# Patient Record
Sex: Male | Born: 2000 | Race: Black or African American | Hispanic: No | Marital: Single | Smoking: Never smoker
Health system: Southern US, Community
[De-identification: ages and names within clinical notes are randomized; demographics above are authoritative.]

## PROBLEM LIST (undated history)

## (undated) DIAGNOSIS — J9383 Other pneumothorax: Secondary | ICD-10-CM

## (undated) DIAGNOSIS — F988 Other specified behavioral and emotional disorders with onset usually occurring in childhood and adolescence: Secondary | ICD-10-CM

## (undated) DIAGNOSIS — J302 Other seasonal allergic rhinitis: Secondary | ICD-10-CM

## (undated) DIAGNOSIS — F809 Developmental disorder of speech and language, unspecified: Secondary | ICD-10-CM

---
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
## 2022-05-21 ENCOUNTER — Encounter (HOSPITAL_COMMUNITY): Payer: Self-pay

## 2022-05-21 ENCOUNTER — Ambulatory Visit (HOSPITAL_COMMUNITY)
Admission: EM | Admit: 2022-05-21 | Discharge: 2022-05-21 | Disposition: A | Discharge status: Home / Self Care | Payer: Medicaid Other

## 2022-05-21 DIAGNOSIS — Z7689 Persons encountering health services in other specified circumstances: Secondary | ICD-10-CM | POA: Diagnosis not present

## 2022-05-21 DIAGNOSIS — R051 Acute cough: Secondary | ICD-10-CM | POA: Diagnosis not present

## 2022-05-21 NOTE — ED Provider Notes (Signed)
[facility]    CSN: [id number] Arrival date & time: 05/21/22  1306     History   Chief Complaint Chief Complaint  Patient presents with   Cough   Letter for School/Work    HPI Kyle James is a 22 y.o. male.  Presents with 1 week history of cough Has improved over the week with OTC medications  Feeling a lot better today, cough is now occasional  No fevers recently Had flu exposures before symptom onset  Work is requiring note to return   History of spontaneous pneumothorax in 2021 Denies any shortness of breath or chest pain today  Past Medical History:  Diagnosis Date   Attention deficit disorder (ADD)    grandmother denies   Seasonal allergies    Speech delay    Spontaneous pneumothorax     Patient Active Problem List   Diagnosis Date Noted   Primary spontaneous pneumothorax 09/11/2019   Spontaneous pneumothorax 09/08/2019    Past Surgical History:  Procedure Laterality Date   PLEURADESIS Right 09/11/2019   Procedure: Mechanical Pleuradesis;  Surgeon: Lajuana Matte, MD;  Location: [facility] OR;  Service: Thoracic;  Laterality: Right;   VIDEO ASSISTED THORACOSCOPY (VATS)/WEDGE RESECTION Right 09/11/2019    XI ROBOTIC ASSISTED THORASCOPY-WEDGE RESECTION OF RIGHT UPPER AND RIGHT LOWER LOBE;  APICAL PLEURECTOMY      Home Medications    Prior to Admission medications   Medication Sig Start Date End Date Taking? Authorizing Provider  acetaminophen (TYLENOL) 500 MG tablet Take 500-1,000 mg by mouth every 6 (six) hours as needed for mild pain.    [provider]  doxycycline (VIBRAMYCIN) 100 MG capsule Take 1 capsule (100 mg total) by mouth 2 (two) times daily. 10/12/21   Nyoka Lint, PA-C  escitalopram (LEXAPRO) 10 MG tablet Take 1 tablet (10 mg total) by mouth daily. 07/08/21   Vanessa Kick, MD  tobramycin (TOBREX) 0.3 % ophthalmic solution Place 1 drop into the right eye every 2 (two) hours. 02/25/21   Jaynee Eagles, PA-C    Family  History Family History  Problem Relation Age of Onset   Diabetes Maternal Grandmother    Hypertension Maternal Grandmother    Hyperlipidemia Maternal Grandmother    Diabetes Maternal Great-grandmother     Social History Social History   Tobacco Use   Smoking status: Never   Smokeless tobacco: Never  Vaping Use   Vaping Use: Never used  Substance Use Topics   Alcohol use: Never   Drug use: Never     Allergies   Patient has no known allergies.   Review of Systems Review of Systems As per HPI  Physical Exam Triage Vital Signs ED Triage Vitals [05/21/22 1646]  Enc Vitals Group     BP 133/74     Pulse Rate 62     Resp 16     Temp 98.1 F (36.7 C)     Temp Source Oral     SpO2 100 %     Weight 125 lb (56.7 kg)     Height 5\' 10"  (1.778 m)     Head Circumference      Peak Flow      Pain Score 0     Pain Loc      Pain Edu?      Excl. in [place]?    No data found.  Updated Vital Signs BP 133/74 (BP Location: Right Arm)   Pulse 62   Temp 98.1 F (36.7 C) (Oral)  Resp 16   Ht 5\' 10"  (1.778 m)   Wt 125 lb (56.7 kg)   SpO2 100%   BMI 17.94 kg/m    Physical Exam Vitals and nursing note reviewed.  Constitutional:      Appearance: Normal appearance.  HENT:     Nose: No congestion or rhinorrhea.     Mouth/Throat:     Mouth: Mucous membranes are moist.     Pharynx: Oropharynx is clear. No posterior oropharyngeal erythema.  Eyes:     Conjunctiva/sclera: Conjunctivae normal.  Cardiovascular:     Rate and Rhythm: Normal rate and regular rhythm.     Pulses: Normal pulses.     Heart sounds: Normal heart sounds.  Pulmonary:     Effort: Pulmonary effort is normal.     Breath sounds: Normal breath sounds.  Musculoskeletal:     Cervical back: Normal range of motion.  Lymphadenopathy:     Cervical: No cervical adenopathy.  Skin:    General: Skin is warm and dry.  Neurological:     Mental Status: He is alert and oriented to person, place, and time.     UC  Treatments / Results  Labs (all labs ordered are listed, but only abnormal results are displayed) Labs Reviewed - No data to display  EKG  Radiology No results found.  Procedures Procedures   Medications Ordered in UC Medications - No data to display  Initial Impression / Assessment and Plan / UC Course  I have reviewed the triage vital signs and the nursing notes.  Pertinent labs & imaging results that were available during my care of the patient were reviewed by me and considered in my medical decision making (see chart for details).  Lungs are clear, well appearing, afebrile Recommend continue symptomatic care at home Seems he is almost back to baseline Work note provided Discussed return precautions, especially with worsening symptoms. Patient agrees to plan  Final Clinical Impressions(s) / UC Diagnoses   Final diagnoses:  Acute cough  Return to work evaluation     Discharge Instructions      Continue symptomatic care as needed.  Please return with any concerns or changes.    ED Prescriptions   None    PDMP not reviewed this encounter.   [place], [place] 05/21/22 1739

## 2022-05-21 NOTE — ED Triage Notes (Signed)
Chief Complaint: Patient having a cough that is productive, yellow mucus. Needing a note to return to work.   Onset: 1 week   Prescriptions or OTC medications tried: Yes- tylenol, Nyquil, ibuprofen    with moderate relief  Sick exposure: Yes- friends with the flu   New foods, medications, or products: No  Recent Travel: No

## 2022-05-21 NOTE — Discharge Instructions (Signed)
Continue symptomatic care as needed.  Please return with any concerns or changes.

## 2022-07-07 ENCOUNTER — Emergency Department (HOSPITAL_COMMUNITY): Payer: Medicaid Other

## 2022-07-07 ENCOUNTER — Encounter (HOSPITAL_COMMUNITY): Payer: Self-pay

## 2022-07-07 ENCOUNTER — Emergency Department (HOSPITAL_COMMUNITY)
Admission: EM | Admit: 2022-07-07 | Discharge: 2022-07-07 | Disposition: A | Discharge status: Home / Self Care | Payer: Medicaid Other | Attending: Emergency Medicine | Admitting: Emergency Medicine

## 2022-07-07 DIAGNOSIS — J9383 Other pneumothorax: Secondary | ICD-10-CM | POA: Insufficient documentation

## 2022-07-07 DIAGNOSIS — R0602 Shortness of breath: Secondary | ICD-10-CM | POA: Diagnosis present

## 2022-07-07 MED ORDER — FENTANYL CITRATE PF 50 MCG/ML IJ SOSY
50.0000 ug | PREFILLED_SYRINGE | Freq: Once | INTRAMUSCULAR | Status: AC
  Administered 2022-07-07: 50 ug via INTRAVENOUS
  Filled 2022-07-07: qty 1

## 2022-07-07 NOTE — ED Notes (Signed)
PT to X-ray

## 2022-07-07 NOTE — ED Provider Notes (Signed)
[facility] Provider Note   CSN: [id number] Arrival date & time: 07/07/22  1725     History  Chief Complaint  Patient presents with   Chest Pain   Shortness of Breath    Possible Pneumothorax    Kyle James is a 22 y.o. male.   Chest Pain Associated symptoms: shortness of breath   Shortness of Breath Associated symptoms: chest pain      This is a 22 year old male presenting to the emergency department due to chest/shoulder pain on the right side of chest.  This happened acutely while he was playing videogames with a friend, states he felt similar to this in 2021 when he had a spontaneous pneumothorax.  Denies any trauma, cough, viral symptoms, history of PEs, recent travel or surgeries.  Home Medications Prior to Admission medications   Medication Sig Start Date End Date Taking? Authorizing Provider  acetaminophen (TYLENOL) 500 MG tablet Take 500-1,000 mg by mouth every 6 (six) hours as needed for mild pain. Patient not taking: Reported on 07/07/2022    [provider]  doxycycline (VIBRAMYCIN) 100 MG capsule Take 1 capsule (100 mg total) by mouth 2 (two) times daily. Patient not taking: Reported on 07/07/2022 10/12/21   Nyoka Lint, PA-C  escitalopram (LEXAPRO) 10 MG tablet Take 1 tablet (10 mg total) by mouth daily. Patient not taking: Reported on 07/07/2022 07/08/21   Vanessa Kick, MD  tobramycin (TOBREX) 0.3 % ophthalmic solution Place 1 drop into the right eye every 2 (two) hours. Patient not taking: Reported on 07/07/2022 02/25/21   Jaynee Eagles, PA-C      Allergies    Patient has no known allergies.    Review of Systems   Review of Systems  Respiratory:  Positive for shortness of breath.   Cardiovascular:  Positive for chest pain.    Physical Exam Updated Vital Signs BP 120/63   Pulse 63   Temp 98.5 F (36.9 C) (Oral)   Resp 13   SpO2 100%  Physical Exam Vitals and nursing note reviewed. Exam  conducted with a chaperone present.  Constitutional:      Appearance: Normal appearance.  HENT:     Head: Normocephalic and atraumatic.  Eyes:     General: No scleral icterus.       Right eye: No discharge.        Left eye: No discharge.     Extraocular Movements: Extraocular movements intact.     Pupils: Pupils are equal, round, and reactive to light.  Cardiovascular:     Rate and Rhythm: Normal rate and regular rhythm.     Pulses: Normal pulses.     Heart sounds: Normal heart sounds. No murmur heard.    No friction rub. No gallop.  Pulmonary:     Effort: Pulmonary effort is normal. No respiratory distress.     Breath sounds: Normal breath sounds.  Abdominal:     General: Abdomen is flat. Bowel sounds are normal. There is no distension.     Palpations: Abdomen is soft.     Tenderness: There is no abdominal tenderness.  Skin:    General: Skin is warm and dry.     Coloration: Skin is not jaundiced.  Neurological:     Mental Status: He is alert. Mental status is at baseline.     Coordination: Coordination normal.     ED Results / Procedures / Treatments   Labs (all labs ordered are listed, but only  abnormal results are displayed) Labs Reviewed - No data to display  EKG None  Radiology DG Chest Portable 1 View  Result Date: 07/07/2022 CLINICAL DATA:  Reassess pneumothorax EXAM: PORTABLE CHEST 1 VIEW COMPARISON:  07/07/2022, 07/08/2021 FINDINGS: Postsurgical changes at the right apex. No acute airspace disease. Normal cardiac size. Small right apical pneumothorax appears grossly stable in size. Maximum pleural-parenchymal separation at the apex measuring 3.9 cm compared with 4 cm previously. IMPRESSION: Postsurgical changes at the right apex with small right apical pneumothorax, grossly stable in size. Electronically Signed   By: Donavan Foil M.D.   On: 07/07/2022 23:06   DG Chest 1 View  Result Date: 07/07/2022 CLINICAL DATA:  Chest pain and shortness of breath. History  of pneumothorax. EXAM: CHEST  1 VIEW COMPARISON:  Chest radiograph 07/08/2021 FINDINGS: The cardiomediastinal silhouette is normal. There is a small right apical pneumothorax measuring approximately 1.7 cm. There is no evidence of tension. There is no focal consolidation or pulmonary edema. Chain sutures are noted in the right upper lobe. There is no pleural effusion. There is no left pneumothorax There is no acute osseous abnormality. IMPRESSION: Small right apical pneumothorax without evidence of tension. These results were called by telephone at the time of interpretation on 07/07/2022 at 6:01 pm to provider Dr Maryan Rued, who verbally acknowledged these results. Electronically Signed   By: Valetta Mole M.D.   On: 07/07/2022 18:01    Procedures Procedures    Medications Ordered in ED Medications  fentaNYL (SUBLIMAZE) injection 50 mcg (50 mcg Intravenous Given 07/07/22 1957)    ED Course/ Medical Decision Making/ A&P Clinical Course as of 07/07/22 2339  Tue Jul 07, 2022  1811 I spoke with Dr. Koleen Nimrod with cardiothoracic surgery.  They recommend supplemental oxygen, repeat chest x-ray in 6 hours, no chest tube given size. [HS]    Clinical Course User Index [HS] Sherrill Raring, PA-C                             Medical Decision Making Amount and/or Complexity of Data Reviewed Radiology: ordered.  Risk Prescription drug management.   Patient presents due to shortness of breath and right-sided chest pain.  Differential includes pneumothorax, PE, MSK  On exam patient's lungs are clear I do not appreciate an area of decreased lung sounds.  He is not tachypneic, not hypoxic.    Chest x-ray ordered in triage shows a right apical pneumothorax, small size.  I reviewed external medical records, patient has history of pneumothorax in the same area in June 2021 that required wedge resection.  Given chest x-ray shows spontaneous pneumothorax I do not think EKG or laboratory workup would be helpful.   Do not think this is ACS, PE, pericarditis based on history and obvious spontaneous pneumothorax.  I consulted CT surgery and spoke with Dr. Koleen Nimrod who advises high flow oxygen, repeat chest x-ray in 6 hours.  Should improve, no chest tube given size.  I reevaluated the patient multiple times, updated patient's mother and grandmother on plan.  Patient was having discomfort in his chest,, this improved with fentanyl.  Repeat chest x-ray shows stable pneumothorax without any expansion or change.  Discussed with patient, we ambulated him with a pulse ox and he was amatory with steady gait, no hypoxia and did not feel short of breath.  Given this I do think it is reasonable for close outpatient follow-up.  We discussed very strict return precautions, he  will call CT surgery's office tomorrow to schedule outpatient follow-up and for recommendations in case they want repeat chest x-ray to monitor progression.         Final Clinical Impression(s) / ED Diagnoses Final diagnoses:  Spontaneous pneumothorax    Rx / DC Orders ED Discharge Orders     None         Sherrill Raring, [place]hal Coria 07/07/22 2339    Blanchie Dessert, MD 07/08/22 2121

## 2022-07-07 NOTE — ED Notes (Signed)
Ambulated pt on SPO2. Pt's O2 sats stayed above 98% on RA while ambulating. Pt denied any SOB while ambulating.

## 2022-07-07 NOTE — Discharge Instructions (Addendum)
You were seen today in the emergency department due to pneumothorax.  This is stayed stable throughout the ED stay.  Tomorrow morning call Dr. Glendale Chard office first thing, let them know you had a pneumothorax in the emergency department and see if they need to reevaluate you tomorrow and repeat chest x-ray.  If you have significant shortness of breath, chest pain, difficulty breathing, coughing up blood, weakness.

## 2022-07-07 NOTE — ED Triage Notes (Addendum)
PT arrives via EMS from home. Pt reports acute onset of chest pain and sob around 1630. Hx of pneumothorax. PT is AxOx4 at this time.

## 2022-07-07 NOTE — ED Notes (Signed)
Calling RT for High flow O2 per [facility]. RT aware and bringing High flow to bedside.

## 2023-09-09 ENCOUNTER — Emergency Department (HOSPITAL_COMMUNITY)
Admission: EM | Admit: 2023-09-09 | Discharge: 2023-09-09 | Disposition: A | Discharge status: Home / Self Care | Attending: Emergency Medicine | Admitting: Emergency Medicine

## 2023-09-09 ENCOUNTER — Other Ambulatory Visit: Payer: Self-pay

## 2023-09-09 ENCOUNTER — Encounter (HOSPITAL_COMMUNITY): Payer: Self-pay | Admitting: Emergency Medicine

## 2023-09-09 DIAGNOSIS — J029 Acute pharyngitis, unspecified: Secondary | ICD-10-CM | POA: Diagnosis present

## 2023-09-09 LAB — GROUP A STREP BY PCR: Group A Strep by PCR: NOT DETECTED

## 2023-09-09 MED ORDER — DEXAMETHASONE 4 MG PO TABS
10.0000 mg | ORAL_TABLET | Freq: Once | ORAL | Status: AC
  Administered 2023-09-09: 10 mg via ORAL
  Filled 2023-09-09: qty 1

## 2023-09-09 MED ORDER — AMOXICILLIN 500 MG PO CAPS
500.0000 mg | ORAL_CAPSULE | Freq: Once | ORAL | Status: AC
  Administered 2023-09-09: 500 mg via ORAL
  Filled 2023-09-09: qty 1

## 2023-09-09 MED ORDER — AMOXICILLIN 500 MG PO CAPS: 500.0000 mg | ORAL_CAPSULE | Freq: Three times a day (TID) | ORAL | 0 refills | Status: AC

## 2023-09-09 NOTE — ED Provider Notes (Signed)
 [facility] EMERGENCY DEPARTMENT AT [facility] Provider Note   CSN: [id number] Arrival date & time: 09/09/23  1619     History Chief Complaint  Patient presents with   Sore Throat    Kyle James is a 23 y.o. male.  Patient presents to the emergency department today with concerns of a sore throat.  Reports a sore throat for the last 2 to 3 days with white spots present on his tonsils.  No contact with anyone recently with strep.  Denies any recent fever, chills or bodyaches.  Does endorse some pain with eating and drinking due to the irritation in his tonsils.   Sore Throat       Home Medications Prior to Admission medications   Medication Sig Start Date End Date Taking? Authorizing Provider  amoxicillin  (AMOXIL ) 500 MG capsule Take 1 capsule (500 mg total) by mouth 3 (three) times daily for 10 days. 09/09/23 09/19/23 Yes Cristal Howatt A, PA-C  acetaminophen  (TYLENOL ) 500 MG tablet Take 500-1,000 mg by mouth every 6 (six) hours as needed for mild pain. Patient not taking: Reported on 07/07/2022    [provider]  doxycycline  (VIBRAMYCIN ) 100 MG capsule Take 1 capsule (100 mg total) by mouth 2 (two) times daily. Patient not taking: Reported on 07/07/2022 10/12/21   Gretel Leaven, PA-C  escitalopram  (LEXAPRO ) 10 MG tablet Take 1 tablet (10 mg total) by mouth daily. Patient not taking: Reported on 07/07/2022 07/08/21   Afton Albright, MD  tobramycin  (TOBREX ) 0.3 % ophthalmic solution Place 1 drop into the right eye every 2 (two) hours. Patient not taking: Reported on 07/07/2022 02/25/21   Adolph Hoop, PA-C      Allergies    Patient has no known allergies.    Review of Systems   Review of Systems  HENT:  Positive for sore throat.   All other systems reviewed and are negative.   Physical Exam Updated Vital Signs BP 128/89 (BP Location: Left Arm)   Pulse 74   Temp 99.9 F (37.7 C) (Oral)   Resp (!) 165   Ht 5\' 10"  (1.778 m)   Wt 54.4 kg   SpO2 100%    BMI 17.22 kg/m  Physical Exam Vitals and nursing note reviewed.  Constitutional:      General: He is not in acute distress.    Appearance: He is well-developed.  HENT:     Head: Normocephalic and atraumatic.     Mouth/Throat:     Mouth: Mucous membranes are moist. No oral lesions.     Pharynx: Uvula midline. Posterior oropharyngeal erythema present. No pharyngeal swelling, oropharyngeal exudate or uvula swelling.     Tonsils: Tonsillar exudate present. No tonsillar abscesses. 2+ on the right. 2+ on the left.  Eyes:     Conjunctiva/sclera: Conjunctivae normal.  Cardiovascular:     Rate and Rhythm: Normal rate and regular rhythm.     Heart sounds: No murmur heard. Pulmonary:     Effort: Pulmonary effort is normal. No respiratory distress.     Breath sounds: Normal breath sounds.  Abdominal:     Palpations: Abdomen is soft.     Tenderness: There is no abdominal tenderness.  Musculoskeletal:        General: No swelling.     Cervical back: Neck supple.  Skin:    General: Skin is warm and dry.     Capillary Refill: Capillary refill takes less than 2 seconds.  Neurological:     Mental Status: He  is alert.  Psychiatric:        Mood and Affect: Mood normal.     ED Results / Procedures / Treatments   Labs (all labs ordered are listed, but only abnormal results are displayed) Labs Reviewed  GROUP A STREP BY PCR    EKG None  Radiology No results found.  Procedures Procedures    Medications Ordered in ED Medications  dexamethasone  (DECADRON ) tablet 10 mg (has no administration in time range)  amoxicillin  (AMOXIL ) capsule 500 mg (has no administration in time range)    ED Course/ Medical Decision Making/ A&P                                 Medical Decision Making Risk Prescription drug management.   This patient presents to the ED for concern of sore throat.  Differential diagnosis includes strep pharyngitis, viral pharyngitis, viral URI, PTA   Lab  Tests:  I Ordered, and personally interpreted labs.  The pertinent results include: Group A strep negative   Medicines ordered and prescription drug management:  I ordered medication including Decadron , amoxicillin  for pharyngitis Reevaluation of the patient after these medicines showed that the patient improved I have reviewed the patients home medicines and have made adjustments as needed   Problem List / ED Course:  Patient presents to the emergency department today with concerns of sore throat.  Reports has been ongoing for the last 2 to 3 days.  States that he has not been around anyone with any similar symptoms recently.  Denies any recent fever, chills, or bodyaches.  Denies any coughing, congestion, or headaches. Physical exam reveals erythematous tonsils with exudate present bilaterally.  No obvious uvular deviation with uvula midline.  Clinically appears to be strep pharyngitis.  Will obtain group A strep for testing. Group A strep negative.  I suspect this is likely a bad swab as patient does not have clinical findings consistent with strep pharyngitis.  Decided with patient to initiate a one-time dose of steroids and antibiotic therapy given clinical presentation.  Encourage patient to follow-up with primary care provider for further evaluation in the next 2 to 3 days to ensure improvement in symptoms.  Advised otherwise following up as needed.  Return precautions discussed such as worsening sore throat, inability to swallow, or any new or worsening symptoms.  Patient agreeable with this plan and verbalized understanding all return precautions.  Patient discharged home in stable condition.  Final Clinical Impression(s) / ED Diagnoses Final diagnoses:  Pharyngitis, unspecified etiology    Rx / DC Orders ED Discharge Orders          Ordered    amoxicillin  (AMOXIL ) 500 MG capsule  3 times daily        09/09/23 1834              Niyana Chesbro A, PA-C 09/09/23 1835     Dalene Duck, MD 09/09/23 [phone]

## 2023-09-09 NOTE — Discharge Instructions (Signed)
 You are seen in the emergency department today for concerns of a sore throat.  Your strep test was negative however you appear to have signs consistent with strep pharyngitis.  I have started you on antibiotic therapy to help manage this as well as given a one-time dose of oral steroids here in the emergency department.  Please plan following up with your primary care fighter for further evaluation.  For any concerns of new or worsening symptoms, please return the emergency department immediately.

## 2023-09-09 NOTE — ED Triage Notes (Signed)
 Patient presents due to white spots and swelling on his tonsils that started yesterday. He report throat pain as well. At first he thought they were stones, but now believes it may be something else.

## 2023-09-09 NOTE — ED Notes (Signed)
Patient denies SOB.
